# Patient Record
Sex: Female | Born: 1990 | Race: Black or African American | Hispanic: No | Marital: Single | Smoking: Current every day smoker
Health system: Southern US, Community
[De-identification: ages and names within clinical notes are randomized; demographics above are authoritative.]

## PROBLEM LIST (undated history)

## (undated) DIAGNOSIS — J40 Bronchitis, not specified as acute or chronic: Secondary | ICD-10-CM

## (undated) DIAGNOSIS — U071 COVID-19: Secondary | ICD-10-CM

## (undated) DIAGNOSIS — G43909 Migraine, unspecified, not intractable, without status migrainosus: Secondary | ICD-10-CM

## (undated) DIAGNOSIS — O139 Gestational [pregnancy-induced] hypertension without significant proteinuria, unspecified trimester: Secondary | ICD-10-CM

## (undated) DIAGNOSIS — E669 Obesity, unspecified: Secondary | ICD-10-CM

## (undated) DIAGNOSIS — A749 Chlamydial infection, unspecified: Secondary | ICD-10-CM

## (undated) HISTORY — PX: TONSILLECTOMY: SUR1361

## (undated) HISTORY — DX: Bronchitis, not specified as acute or chronic: J40

## (undated) HISTORY — DX: Obesity, unspecified: E66.9

## (undated) HISTORY — DX: Migraine, unspecified, not intractable, without status migrainosus: G43.909

## (undated) HISTORY — PX: LASIK: SHX215

## (undated) HISTORY — DX: Gestational (pregnancy-induced) hypertension without significant proteinuria, unspecified trimester: O13.9

## (undated) HISTORY — DX: COVID-19: U07.1

## (undated) HISTORY — PX: HX WISDOM TEETH EXTRACTION: SHX21

## (undated) HISTORY — DX: Chlamydial infection, unspecified: A74.9

---
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
## 2011-03-02 ENCOUNTER — Emergency Department (HOSPITAL_COMMUNITY)
Admission: EM | Admit: 2011-03-02 | Discharge: 2011-03-03 | Disposition: A | Discharge status: Home / Self Care | Payer: Medicaid Other | Attending: Emergency Medicine | Admitting: Emergency Medicine

## 2011-03-02 ENCOUNTER — Emergency Department (HOSPITAL_COMMUNITY): Payer: Medicaid Other

## 2011-03-02 DIAGNOSIS — N898 Other specified noninflammatory disorders of vagina: Secondary | ICD-10-CM | POA: Insufficient documentation

## 2011-03-02 DIAGNOSIS — R109 Unspecified abdominal pain: Secondary | ICD-10-CM | POA: Insufficient documentation

## 2011-03-02 DIAGNOSIS — N390 Urinary tract infection, site not specified: Secondary | ICD-10-CM | POA: Insufficient documentation

## 2011-03-02 LAB — CBC
Hemoglobin: 13.9 g/dL (ref 12.0–15.0)
MCH: 31.3 pg (ref 26.0–34.0)
MCHC: 34.6 g/dL (ref 30.0–36.0)
Platelets: 203 10*3/uL (ref 150–400)
RDW: 12.1 % (ref 11.5–15.5)

## 2011-03-02 LAB — DIFFERENTIAL
Basophils Absolute: 0 10*3/uL (ref 0.0–0.1)
Basophils Relative: 0 % (ref 0–1)
Eosinophils Absolute: 0.3 10*3/uL (ref 0.0–0.7)
Eosinophils Relative: 3 % (ref 0–5)
Monocytes Absolute: 0.9 10*3/uL (ref 0.1–1.0)
Monocytes Relative: 8 % (ref 3–12)
Neutro Abs: 6.2 10*3/uL (ref 1.7–7.7)

## 2011-03-02 LAB — WET PREP, GENITAL

## 2011-03-02 LAB — URINALYSIS, ROUTINE W REFLEX MICROSCOPIC
Nitrite: POSITIVE — AB
Protein, ur: 100 mg/dL — AB
Specific Gravity, Urine: 1.028 (ref 1.005–1.030)
Urobilinogen, UA: 1 mg/dL (ref 0.0–1.0)

## 2011-03-02 LAB — URINE MICROSCOPIC-ADD ON

## 2011-03-02 LAB — POCT PREGNANCY, URINE: Preg Test, Ur: NEGATIVE

## 2011-03-02 LAB — ABO/RH: ABO/RH(D): B POS

## 2011-03-02 LAB — HCG, QUANTITATIVE, PREGNANCY: hCG, Beta Chain, Quant, S: <1 m[IU]/mL (ref <5)

## 2011-03-05 LAB — GC/CHLAMYDIA PROBE AMP, GENITAL: Chlamydia, DNA Probe: NEGATIVE

## 2011-03-22 ENCOUNTER — Emergency Department (HOSPITAL_COMMUNITY): Payer: Medicaid Other

## 2011-03-22 ENCOUNTER — Encounter: Payer: Self-pay | Admitting: Cardiology

## 2011-03-22 ENCOUNTER — Emergency Department (HOSPITAL_COMMUNITY)
Admission: EM | Admit: 2011-03-22 | Discharge: 2011-03-22 | Disposition: A | Discharge status: Home / Self Care | Payer: Medicaid Other | Attending: Emergency Medicine | Admitting: Emergency Medicine

## 2011-03-22 DIAGNOSIS — M79609 Pain in unspecified limb: Secondary | ICD-10-CM | POA: Insufficient documentation

## 2011-03-22 DIAGNOSIS — F411 Generalized anxiety disorder: Secondary | ICD-10-CM | POA: Insufficient documentation

## 2011-03-22 DIAGNOSIS — M25561 Pain in right knee: Secondary | ICD-10-CM

## 2011-03-22 DIAGNOSIS — Y92009 Unspecified place in unspecified non-institutional (private) residence as the place of occurrence of the external cause: Secondary | ICD-10-CM | POA: Insufficient documentation

## 2011-03-22 DIAGNOSIS — M25569 Pain in unspecified knee: Secondary | ICD-10-CM | POA: Insufficient documentation

## 2011-03-22 DIAGNOSIS — M25539 Pain in unspecified wrist: Secondary | ICD-10-CM | POA: Insufficient documentation

## 2011-03-22 DIAGNOSIS — R51 Headache: Secondary | ICD-10-CM | POA: Insufficient documentation

## 2011-03-22 DIAGNOSIS — S63509A Unspecified sprain of unspecified wrist, initial encounter: Secondary | ICD-10-CM | POA: Insufficient documentation

## 2011-03-22 DIAGNOSIS — S63501A Unspecified sprain of right wrist, initial encounter: Secondary | ICD-10-CM

## 2011-03-22 MED ORDER — HYDROCODONE-ACETAMINOPHEN 5-500 MG PO TABS: 1.0000 | ORAL_TABLET | Freq: Four times a day (QID) | ORAL | Status: AC | PRN

## 2011-03-22 MED ORDER — OXYCODONE-ACETAMINOPHEN 5-325 MG PO TABS
1.0000 | ORAL_TABLET | Freq: Once | ORAL | Status: AC
  Administered 2011-03-22: 1 via ORAL
  Filled 2011-03-22: qty 1

## 2011-03-22 MED ORDER — IBUPROFEN 800 MG PO TABS: 800.0000 mg | ORAL_TABLET | Freq: Three times a day (TID) | ORAL | Status: AC

## 2011-03-22 MED ORDER — LORAZEPAM 1 MG PO TABS
1.0000 mg | ORAL_TABLET | Freq: Once | ORAL | Status: AC
  Administered 2011-03-22: 1 mg via ORAL
  Filled 2011-03-22: qty 1

## 2011-03-22 NOTE — Progress Notes (Signed)
Orthopedic Tech Progress Note Patient Details:  Carol Rich [birth year] [id number]  Other Ortho Devices Type of Ortho Device: Other (comment) (velcro wrist splint) Ortho Device Location: right wrist   Nikki Dom 03/22/2011, 3:26 PM

## 2011-03-22 NOTE — ED Provider Notes (Signed)
Medical screening examination/treatment/procedure(s) were performed by non-physician practitioner and as supervising physician I was immediately available for consultation/collaboration.  Doug Sou, MD 03/22/11 1544
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
## 2011-05-27 ENCOUNTER — Emergency Department (HOSPITAL_COMMUNITY)
Admission: EM | Admit: 2011-05-27 | Discharge: 2011-05-27 | Disposition: A | Discharge status: Home / Self Care | Payer: Self-pay | Attending: Emergency Medicine | Admitting: Emergency Medicine

## 2011-05-27 ENCOUNTER — Encounter (HOSPITAL_COMMUNITY): Payer: Self-pay | Admitting: Emergency Medicine

## 2011-05-27 DIAGNOSIS — S61209A Unspecified open wound of unspecified finger without damage to nail, initial encounter: Secondary | ICD-10-CM | POA: Insufficient documentation

## 2011-05-27 DIAGNOSIS — J45909 Unspecified asthma, uncomplicated: Secondary | ICD-10-CM | POA: Insufficient documentation

## 2011-05-27 DIAGNOSIS — S61411A Laceration without foreign body of right hand, initial encounter: Secondary | ICD-10-CM

## 2011-05-27 DIAGNOSIS — F411 Generalized anxiety disorder: Secondary | ICD-10-CM | POA: Insufficient documentation

## 2011-05-27 DIAGNOSIS — R064 Hyperventilation: Secondary | ICD-10-CM | POA: Insufficient documentation

## 2011-05-27 MED ORDER — ALPRAZOLAM 0.5 MG PO TABS
0.5000 mg | ORAL_TABLET | Freq: Once | ORAL | Status: AC
  Administered 2011-05-27: 0.5 mg via ORAL
  Filled 2011-05-27: qty 1

## 2011-05-27 MED ORDER — ACETAMINOPHEN 325 MG PO TABS
650.0000 mg | ORAL_TABLET | Freq: Once | ORAL | Status: AC
  Administered 2011-05-27: 650 mg via ORAL
  Filled 2011-05-27: qty 2
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
## 2011-05-27 NOTE — ED Notes (Signed)
Pt arrived at triage hyperventilating.  Pt has laceration to R hand at base of thumb.  Pt will not answer any questions.  Bandage placed at triage and pt encouraged to slow breathing.

## 2014-05-08 ENCOUNTER — Emergency Department (HOSPITAL_COMMUNITY): Payer: Self-pay | Admitting: EXTERNAL

## 2015-04-04 ENCOUNTER — Encounter (HOSPITAL_COMMUNITY): Payer: Self-pay | Admitting: *Deleted

## 2015-04-04 ENCOUNTER — Emergency Department (HOSPITAL_COMMUNITY): Payer: BLUE CROSS/BLUE SHIELD

## 2015-04-04 ENCOUNTER — Emergency Department (HOSPITAL_COMMUNITY)
Admission: EM | Admit: 2015-04-04 | Discharge: 2015-04-04 | Disposition: A | Discharge status: Home / Self Care | Payer: BLUE CROSS/BLUE SHIELD | Attending: Emergency Medicine | Admitting: Emergency Medicine

## 2015-04-04 DIAGNOSIS — J45909 Unspecified asthma, uncomplicated: Secondary | ICD-10-CM | POA: Diagnosis not present

## 2015-04-04 DIAGNOSIS — N939 Abnormal uterine and vaginal bleeding, unspecified: Secondary | ICD-10-CM | POA: Diagnosis present

## 2015-04-04 DIAGNOSIS — N946 Dysmenorrhea, unspecified: Secondary | ICD-10-CM | POA: Diagnosis not present

## 2015-04-04 DIAGNOSIS — Z3202 Encounter for pregnancy test, result negative: Secondary | ICD-10-CM | POA: Diagnosis not present

## 2015-04-04 DIAGNOSIS — F17210 Nicotine dependence, cigarettes, uncomplicated: Secondary | ICD-10-CM | POA: Insufficient documentation

## 2015-04-04 LAB — WET PREP, GENITAL
Sperm: NONE SEEN
TRICH WET PREP: NONE SEEN
Yeast Wet Prep HPF POC: NONE SEEN

## 2015-04-04 LAB — CBC
HEMATOCRIT: 40.3 % (ref 36.0–46.0)
Hemoglobin: 13.6 g/dL (ref 12.0–15.0)
MCH: 31.5 pg (ref 26.0–34.0)
MCHC: 33.7 g/dL (ref 30.0–36.0)
MCV: 93.3 fL (ref 78.0–100.0)
PLATELETS: 253 10*3/uL (ref 150–400)
RBC: 4.32 MIL/uL (ref 3.87–5.11)
RDW: 12.5 % (ref 11.5–15.5)
WBC: 9.7 10*3/uL (ref 4.0–10.5)

## 2015-04-04 LAB — I-STAT BETA HCG BLOOD, ED (MC, WL, AP ONLY): I-stat hCG, quantitative: <5 m[IU]/mL (ref <5)

## 2015-04-04 LAB — GC/CHLAMYDIA PROBE AMP (~~LOC~~) NOT AT ARMC
Chlamydia: NEGATIVE
Neisseria Gonorrhea: NEGATIVE

## 2015-04-04 LAB — ABO/RH: ABO/RH(D): B POS

## 2015-04-04 MED ORDER — NAPROXEN 500 MG PO TABS
500.0000 mg | ORAL_TABLET | Freq: Once | ORAL | Status: AC
  Administered 2015-04-04: 500 mg via ORAL
  Filled 2015-04-04: qty 1

## 2015-04-04 MED ORDER — NAPROXEN 500 MG PO TABS: 500.0000 mg | ORAL_TABLET | Freq: Two times a day (BID) | ORAL | Status: AC

## 2015-04-04 NOTE — Discharge Instructions (Signed)

## 2015-04-04 NOTE — ED Notes (Signed)
Pt reports lower abd pain and vaginal bleeding - LMP 02/06/15 pt admits to (+) home pregnancy test, has not been to seen by an OBGYN. Pt reports bleeding is moderate approx 4-5 pads during the day.

## 2015-04-04 NOTE — ED Provider Notes (Signed)
CSN: [id number]     Arrival date & time 04/04/15  0241 History   First MD Initiated Contact with Patient 04/04/15 0243     Chief Complaint  Patient presents with  . Vaginal Bleeding  . Abdominal Pain     (Consider location/radiation/quality/duration/timing/severity/associated sxs/prior Treatment) HPI Comments: Patient is a G67P0 female who presents to the emergency department for complaints of abdominal pain and vaginal bleeding. Patient states that her last menstrual period was on 02/06/2015. She reports taking a positive home pregnancy test. She has yet to follow up with an OB/GYN, but does have an appointment with [facility] in 2 days. She reports that her abdominal pain began yesterday. It was sharp and cramping in nature and located in her suprapubic region. No radiation of her pain. She also reports vaginal bleeding. She has required 4-5 pads since her bleeding began. No associated fever, nausea, vomiting or dysuria or hematuria, vaginal discharge, or bowel changes. No history of abdominal surgeries.  The history is provided by the patient. No language interpreter was used.    Past Medical History  Diagnosis Date  . Asthma    History reviewed. No pertinent past surgical history. History reviewed. No pertinent family history. Social History  Substance Use Topics  . Smoking status: Current Every Day Smoker -- 0.50 packs/day    Types: Cigarettes  . Smokeless tobacco: None  . Alcohol Use: Yes   OB History    Gravida Para Term Preterm AB TAB SAB Ectopic Multiple Living   1               Review of Systems  Constitutional: Negative for fever.  Gastrointestinal: Positive for abdominal pain. Negative for nausea and vomiting.  Genitourinary: Positive for vaginal bleeding and pelvic pain. Negative for dysuria.  All other systems reviewed and are negative.   Allergies  Review of patient's allergies indicates no known allergies.  Home Medications   Prior to Admission  medications   Medication Sig Start Date End Date Taking? Authorizing Provider  Vitamins A & D (VITAMIN A & D EX) Apply 1 application topically 3 (three) times daily as needed (for tattoos and piercings).   Yes Historical Provider, MD  naproxen (NAPROSYN) 500 MG tablet Take 1 tablet (500 mg total) by mouth 2 (two) times daily. 04/04/15   Antony Madura, PA-C   BP 108/74 mmHg  Pulse 80  Temp(Src) 98.1 F (36.7 C) (Oral)  Resp 16  SpO2 100%  LMP 02/06/2015 (Exact Date)   Physical Exam  Constitutional: She is oriented to person, place, and time. She appears well-developed and well-nourished. No distress.  HENT:  Head: Normocephalic and atraumatic.  Eyes: Conjunctivae and EOM are normal. No scleral icterus.  Neck: Normal range of motion.  Cardiovascular: Normal rate, regular rhythm and intact distal pulses.   Pulmonary/Chest: Effort normal. No respiratory distress. She has no wheezes.  Respirations even and unlabored  Abdominal: Soft. She exhibits no distension. There is tenderness. There is no rebound.  Abdomen soft. There is some mild focal tenderness in the suprapubic region. No masses or peritoneal signs noted. Bowel sounds normoactive.  Genitourinary: There is no rash, tenderness or lesion on the right labia. There is no rash, tenderness or lesion on the left labia. Uterus is tender (mild). Cervix exhibits no motion tenderness and no friability. Right adnexum displays no mass and no tenderness. Left adnexum displays tenderness (mild). Left adnexum displays no mass. There is bleeding (blood in vaginal vault; no clots noted) in the  vagina. No vaginal discharge found.  Musculoskeletal: Normal range of motion.  Neurological: She is alert and oriented to person, place, and time. She exhibits normal muscle tone. Coordination normal.  Skin: Skin is warm and dry. No rash noted. She is not diaphoretic. No erythema. No pallor.  Psychiatric: She has a normal mood and affect. Her behavior is normal.   Nursing note and vitals reviewed.   ED Course  Procedures (including critical care time) Labs Review Labs Reviewed  WET PREP, GENITAL - Abnormal; Notable for the following:    Clue Cells Wet Prep HPF POC PRESENT (*)    WBC, Wet Prep HPF POC RARE (*)    All other components within normal limits  CBC  I-STAT BETA HCG BLOOD, ED ([facility], [facility], [facility] ONLY)  ABO/RH  GC/CHLAMYDIA PROBE AMP ([facility]) NOT AT [facility]    Imaging Review US Transvaginal Non-ob  04/04/2015  CLINICAL DATA:  24 year old female with pelvic pain and bleeding x1 day. Negative home pregnancy test. EXAM: TRANSABDOMINAL AND TRANSVAGINAL ULTRASOUND OF PELVIS DOPPLER ULTRASOUND OF OVARIES TECHNIQUE: Both transabdominal and transvaginal ultrasound examinations of the pelvis were performed. Transabdominal technique was performed for global imaging of the pelvis including uterus, ovaries, adnexal regions, and pelvic cul-de-sac. It was necessary to proceed with endovaginal exam following the transabdominal exam to visualize the endometrium and the ovaries. Color and duplex Doppler ultrasound was utilized to evaluate blood flow to the ovaries. COMPARISON:  Ultrasound dated 03/02/2011 FINDINGS: Uterus Measurements: 8.0 x 4.2 x 5.4 cm. The uterus is anteverted. No fibroids or other mass visualized. The uterus is slightly heterogeneous with echogenic linear striation suggestive of adenomyosis. Clinical correlation is recommended. Endometrium Thickness: 2 mm. Minimal echogenic debris within the lower endometrial canal may represent blood products. No focal abnormality visualized. Right ovary Measurements: 3.5 x 3.8 x 3.4 cm. Normal appearance/no adnexal mass. Left ovary Measurements: 4.3 x 2.3 x 4.4 cm. Normal appearance/no adnexal mass. Pulsed Doppler evaluation of both ovaries demonstrates normal low-resistance arterial and venous waveforms. Other findings No free fluid. IMPRESSION: Slightly heterogeneous uterus with sonographic findings of possible  adenomyosis. Clinical correlation is recommended. Unremarkable ovaries.  Bilateral ovarian Doppler flow. Electronically Signed   By: Elgie Collard M.D.   On: 04/04/2015 04:46   US Pelvis Complete  04/04/2015  CLINICAL DATA:  24 year old female with pelvic pain and bleeding x1 day. Negative home pregnancy test. EXAM: TRANSABDOMINAL AND TRANSVAGINAL ULTRASOUND OF PELVIS DOPPLER ULTRASOUND OF OVARIES TECHNIQUE: Both transabdominal and transvaginal ultrasound examinations of the pelvis were performed. Transabdominal technique was performed for global imaging of the pelvis including uterus, ovaries, adnexal regions, and pelvic cul-de-sac. It was necessary to proceed with endovaginal exam following the transabdominal exam to visualize the endometrium and the ovaries. Color and duplex Doppler ultrasound was utilized to evaluate blood flow to the ovaries. COMPARISON:  Ultrasound dated 03/02/2011 FINDINGS: Uterus Measurements: 8.0 x 4.2 x 5.4 cm. The uterus is anteverted. No fibroids or other mass visualized. The uterus is slightly heterogeneous with echogenic linear striation suggestive of adenomyosis. Clinical correlation is recommended. Endometrium Thickness: 2 mm. Minimal echogenic debris within the lower endometrial canal may represent blood products. No focal abnormality visualized. Right ovary Measurements: 3.5 x 3.8 x 3.4 cm. Normal appearance/no adnexal mass. Left ovary Measurements: 4.3 x 2.3 x 4.4 cm. Normal appearance/no adnexal mass. Pulsed Doppler evaluation of both ovaries demonstrates normal low-resistance arterial and venous waveforms. Other findings No free fluid. IMPRESSION: Slightly heterogeneous uterus with sonographic findings of possible adenomyosis. Clinical correlation  is recommended. Unremarkable ovaries.  Bilateral ovarian Doppler flow. Electronically Signed   By: Elgie CollardArash  Radparvar M.D.   On: 04/04/2015 04:46   [place] Art/ven Flow Abd Pelv Doppler  04/04/2015  CLINICAL DATA:  24 year old female  with pelvic pain and bleeding x1 day. Negative home pregnancy test. EXAM: TRANSABDOMINAL AND TRANSVAGINAL ULTRASOUND OF PELVIS DOPPLER ULTRASOUND OF OVARIES TECHNIQUE: Both transabdominal and transvaginal ultrasound examinations of the pelvis were performed. Transabdominal technique was performed for global imaging of the pelvis including uterus, ovaries, adnexal regions, and pelvic cul-de-sac. It was necessary to proceed with endovaginal exam following the transabdominal exam to visualize the endometrium and the ovaries. Color and duplex Doppler ultrasound was utilized to evaluate blood flow to the ovaries. COMPARISON:  Ultrasound dated 03/02/2011 FINDINGS: Uterus Measurements: 8.0 x 4.2 x 5.4 cm. The uterus is anteverted. No fibroids or other mass visualized. The uterus is slightly heterogeneous with echogenic linear striation suggestive of adenomyosis. Clinical correlation is recommended. Endometrium Thickness: 2 mm. Minimal echogenic debris within the lower endometrial canal may represent blood products. No focal abnormality visualized. Right ovary Measurements: 3.5 x 3.8 x 3.4 cm. Normal appearance/no adnexal mass. Left ovary Measurements: 4.3 x 2.3 x 4.4 cm. Normal appearance/no adnexal mass. Pulsed Doppler evaluation of both ovaries demonstrates normal low-resistance arterial and venous waveforms. Other findings No free fluid. IMPRESSION: Slightly heterogeneous uterus with sonographic findings of possible adenomyosis. Clinical correlation is recommended. Unremarkable ovaries.  Bilateral ovarian Doppler flow. Electronically Signed   By: Elgie CollardArash  Radparvar M.D.   On: 04/04/2015 04:46     I have personally reviewed and evaluated these images and lab results as part of my medical decision-making.   EKG Interpretation None      MDM   Final diagnoses:  Dysmenorrhea    24 year old 442P0 female presents to the emergency department reporting suprapubic abdominal pain with vaginal bleeding, onset  yesterday. She reports a positive home pregnancy test with LMP 02/06/15. Pregnancy today is negative. Laboratory workup is noncontributory. No evidence of ovarian torsion, TOA, or ovarian cyst on ultrasound. No retained POC from potential miscarriage.   Patient is hemodynamically stable. She is resting comfortably, in no distress or discomfort, on reexamination. Symptoms consistent with dysmenorrhea. Doubt other emergent abdominopelvic process. Patient has app[facility]. Have advised her to keep this appointment. Will d/c with Naproxen. Return precautions given. Patient discharged in good condition with no unaddressed concerns.   Filed Vitals:   04/04/15 0251 04/04/15 0500  BP: 112/75 108/74  Pulse: 84 80  Temp: 98.1 F (36.7 C)   TempSrc: Oral   Resp: 20 16  SpO2: 100% 100%     Antony MaduraKelly Sabriah Hobbins, PA-C 04/04/15 [id number]  Azalia BilisKevin Campos, MD 04/04/15 [phone]

## 2015-07-18 ENCOUNTER — Encounter (HOSPITAL_COMMUNITY): Payer: Self-pay | Admitting: Emergency Medicine

## 2015-07-18 ENCOUNTER — Emergency Department (HOSPITAL_COMMUNITY): Payer: BLUE CROSS/BLUE SHIELD

## 2015-07-18 DIAGNOSIS — J45901 Unspecified asthma with (acute) exacerbation: Secondary | ICD-10-CM | POA: Diagnosis not present

## 2015-07-18 DIAGNOSIS — F17210 Nicotine dependence, cigarettes, uncomplicated: Secondary | ICD-10-CM | POA: Insufficient documentation

## 2015-07-18 DIAGNOSIS — R079 Chest pain, unspecified: Secondary | ICD-10-CM | POA: Diagnosis not present

## 2015-07-18 NOTE — ED Notes (Signed)
Pt. reports intermittent central chest pain with mild SOB , denies nausea or diaphoresis . Denies pain at triage , no cough or fever .

## 2015-07-19 ENCOUNTER — Emergency Department (HOSPITAL_COMMUNITY)
Admission: EM | Admit: 2015-07-19 | Discharge: 2015-07-19 | Disposition: A | Discharge status: Home / Self Care | Payer: BLUE CROSS/BLUE SHIELD | Attending: Emergency Medicine | Admitting: Emergency Medicine

## 2015-07-19 LAB — BASIC METABOLIC PANEL
Anion gap: 9 (ref 5–15)
BUN: 14 mg/dL (ref 6–20)
CHLORIDE: 108 mmol/L (ref 101–111)
CO2: 23 mmol/L (ref 22–32)
Calcium: 9.4 mg/dL (ref 8.9–10.3)
Creatinine, Ser: 0.95 mg/dL (ref 0.44–1.00)
GFR calc Af Amer: >60 mL/min (ref >60)
GFR calc non Af Amer: >60 mL/min (ref >60)
GLUCOSE: 87 mg/dL (ref 65–99)
POTASSIUM: 3.8 mmol/L (ref 3.5–5.1)
Sodium: 140 mmol/L (ref 135–145)

## 2015-07-19 LAB — CBC
HEMATOCRIT: 40.7 % (ref 36.0–46.0)
Hemoglobin: 13.6 g/dL (ref 12.0–15.0)
MCH: 31 pg (ref 26.0–34.0)
MCHC: 33.4 g/dL (ref 30.0–36.0)
MCV: 92.7 fL (ref 78.0–100.0)
Platelets: 241 10*3/uL (ref 150–400)
RBC: 4.39 MIL/uL (ref 3.87–5.11)
RDW: 12.2 % (ref 11.5–15.5)
WBC: 10.2 10*3/uL (ref 4.0–10.5)

## 2015-07-19 LAB — I-STAT TROPONIN, ED: Troponin i, poc: 0 ng/mL (ref 0.00–0.08)

## 2015-08-08 ENCOUNTER — Encounter (HOSPITAL_COMMUNITY): Payer: Self-pay | Admitting: *Deleted

## 2015-08-08 ENCOUNTER — Inpatient Hospital Stay (HOSPITAL_COMMUNITY)
Admission: AD | Admit: 2015-08-08 | Discharge: 2015-08-08 | Disposition: A | Discharge status: Home / Self Care | Payer: BLUE CROSS/BLUE SHIELD | Source: Ambulatory Visit | Attending: Obstetrics & Gynecology | Admitting: Obstetrics & Gynecology

## 2015-08-08 DIAGNOSIS — Z113 Encounter for screening for infections with a predominantly sexual mode of transmission: Secondary | ICD-10-CM | POA: Insufficient documentation

## 2015-08-08 DIAGNOSIS — A499 Bacterial infection, unspecified: Secondary | ICD-10-CM

## 2015-08-08 DIAGNOSIS — Z79899 Other long term (current) drug therapy: Secondary | ICD-10-CM | POA: Insufficient documentation

## 2015-08-08 DIAGNOSIS — N760 Acute vaginitis: Secondary | ICD-10-CM | POA: Insufficient documentation

## 2015-08-08 DIAGNOSIS — F17210 Nicotine dependence, cigarettes, uncomplicated: Secondary | ICD-10-CM | POA: Diagnosis not present

## 2015-08-08 DIAGNOSIS — Z202 Contact with and (suspected) exposure to infections with a predominantly sexual mode of transmission: Secondary | ICD-10-CM | POA: Diagnosis present

## 2015-08-08 DIAGNOSIS — B9689 Other specified bacterial agents as the cause of diseases classified elsewhere: Secondary | ICD-10-CM

## 2015-08-08 DIAGNOSIS — A549 Gonococcal infection, unspecified: Secondary | ICD-10-CM

## 2015-08-08 LAB — URINALYSIS, ROUTINE W REFLEX MICROSCOPIC
BILIRUBIN URINE: NEGATIVE
GLUCOSE, UA: NEGATIVE mg/dL
HGB URINE DIPSTICK: NEGATIVE
KETONES UR: NEGATIVE mg/dL
Leukocytes, UA: NEGATIVE
Nitrite: NEGATIVE
PROTEIN: NEGATIVE mg/dL
Specific Gravity, Urine: 1.02 (ref 1.005–1.030)
pH: 6 (ref 5.0–8.0)

## 2015-08-08 LAB — WET PREP, GENITAL
Sperm: NONE SEEN
Trich, Wet Prep: NONE SEEN
Yeast Wet Prep HPF POC: NONE SEEN

## 2015-08-08 LAB — GC/CHLAMYDIA PROBE AMP (~~LOC~~) NOT AT ARMC
CHLAMYDIA, DNA PROBE: NEGATIVE
Neisseria Gonorrhea: POSITIVE — AB

## 2015-08-08 LAB — POCT PREGNANCY, URINE: PREG TEST UR: NEGATIVE

## 2015-08-08 LAB — RPR: RPR: NONREACTIVE

## 2015-08-08 LAB — HIV ANTIBODY (ROUTINE TESTING W REFLEX): HIV SCREEN 4TH GENERATION: NONREACTIVE

## 2015-08-08 MED ORDER — METRONIDAZOLE 500 MG PO TABS: 500.0000 mg | ORAL_TABLET | Freq: Two times a day (BID) | ORAL | Status: AC

## 2015-08-08 MED ORDER — KETOROLAC TROMETHAMINE 60 MG/2ML IM SOLN: 60.0000 mg | Freq: Once | INTRAMUSCULAR | Status: DC

## 2015-08-08 NOTE — MAU Provider Note (Signed)
History   [id number]   Chief Complaint  Patient presents with  . Exposure to STD    HPI Carol Rich is a 25 y.o. female  G1P0 here desiring to be checked for sexually transmitted infections.  Informed by boyfriend that he has had sexual encounters with someone else and may have an infections.  Pt reports a vaginal discharge that she began using monistat for yesterday.  +UTI two weeks ago and recently completed antibiotic.  No current birth control use.  Last intercourse yesterday.      Patient's last menstrual period was 07/19/2015.  OB History  Gravida Para Term Preterm AB SAB TAB Ectopic Multiple Living  1             # Outcome Date GA Lbr Len/2nd Weight Sex Delivery Anes PTL Lv  1 Gravida      Vag-Spont   FD      Past Medical History  Diagnosis Date  . Asthma     History reviewed. No pertinent family history.  Social History   Social History  . Marital Status: Single    Spouse Name: N/A  . Number of Children: N/A  . Years of Education: N/A   Social History Main Topics  . Smoking status: Current Every Day Smoker -- 0.00 packs/day    Types: Cigarettes  . Smokeless tobacco: None  . Alcohol Use: Yes     Comment: LAST   TIME-   MARCH  . Drug Use: No  . Sexual Activity: Yes    Birth Control/ Protection: None   Other Topics Concern  . None   Social History Narrative    Allergies  Allergen Reactions  . Latex Itching    No current facility-administered medications on file prior to encounter.   Current Outpatient Prescriptions on File Prior to Encounter  Medication Sig Dispense Refill  . naproxen (NAPROSYN) 500 MG tablet Take 1 tablet (500 mg total) by mouth 2 (two) times daily. 30 tablet 0  . Vitamins A & D (VITAMIN A & D EX) Apply 1 application topically 3 (three) times daily as needed (for tattoos and piercings).       Review of Systems  Genitourinary: Positive for vaginal discharge (slight itching). Negative for pelvic pain.  All other systems  reviewed and are negative.    Physical Exam   Filed Vitals:   08/08/15 0045  BP: 128/82  Pulse: 71  Temp: 98.3 F (36.8 C)  TempSrc: Oral  Resp: 20  Height: 5\' 4"  (1.626 m)  Weight: 83.972 kg (185 lb 2 oz)    Physical Exam  Constitutional: She is oriented to person, place, and time. She appears well-developed and well-nourished. No distress.  HENT:  Head: Normocephalic.  Neck: Normal range of motion. Neck supple.  Cardiovascular: Normal rate, regular rhythm and normal heart sounds.   Respiratory: Effort normal and breath sounds normal. No respiratory distress.  GI: Soft. She exhibits no mass. There is no tenderness. There is no rebound, no guarding and no CVA tenderness.  Genitourinary: Cervix exhibits no motion tenderness and no discharge. Vaginal discharge (white, creamy) found.  Musculoskeletal: Normal range of motion. She exhibits no edema.  Neurological: She is alert and oriented to person, place, and time.  Skin: Skin is warm and dry.  Psychiatric: She has a normal mood and affect.    MAU Course  Procedures Results for orders placed or performed during the hospital encounter of 08/08/15 (from the past 24 hour(s))  Urinalysis, Routine w reflex  microscopic (not at [facility])     Status: None   Collection Time: 08/08/15 12:56 AM  Result Value Ref Range   Color, Urine YELLOW YELLOW   APPearance CLEAR CLEAR   Specific Gravity, Urine 1.020 1.005 - 1.030   pH 6.0 5.0 - 8.0   Glucose, UA NEGATIVE NEGATIVE mg/dL   Hgb urine dipstick NEGATIVE NEGATIVE   Bilirubin Urine NEGATIVE NEGATIVE   Ketones, ur NEGATIVE NEGATIVE mg/dL   Protein, ur NEGATIVE NEGATIVE mg/dL   Nitrite NEGATIVE NEGATIVE   Leukocytes, UA NEGATIVE NEGATIVE  Pregnancy, urine POC     Status: None   Collection Time: 08/08/15  1:00 AM  Result Value Ref Range   Preg Test, Ur NEGATIVE NEGATIVE  Wet prep, genital     Status: Abnormal   Collection Time: 08/08/15  1:20 AM  Result Value Ref Range   Yeast Wet  Prep HPF POC NONE SEEN NONE SEEN   Trich, Wet Prep NONE SEEN NONE SEEN   Clue Cells Wet Prep HPF POC PRESENT (A) NONE SEEN   WBC, Wet Prep HPF POC MANY (A) NONE SEEN   Sperm NONE SEEN      Assessment and Plan  Bacterial Vaginosis STD Screen  Plan: Discharge home RX Flagyl 500 mg BID x 7 days RPR/HIV/GC/CT pending   Marlis Edelson, CNM 08/08/2015 2:01 AM

## 2015-08-08 NOTE — MAU Note (Signed)
PT SAYS TONIGHT  AT WORK-  THAT HER BOYFRIEND  TEXTED HER  AND TOLD  WAS CHEATING   AND THINKS  HE HAS  STD- .     PT SAYS  SHE HAS VAG D/C  AND  STARTED  MONISTAT YESTERDAY.     HAD UTI   2 WEEKS  AGO- TOOK ALL ANTX.            NO BIRTH  CONTROL.   LAST SEX-  YESTERDAY.

## 2015-08-08 NOTE — Discharge Instructions (Signed)

## 2015-08-09 ENCOUNTER — Ambulatory Visit: Payer: BLUE CROSS/BLUE SHIELD

## 2015-08-09 DIAGNOSIS — A549 Gonococcal infection, unspecified: Secondary | ICD-10-CM

## 2015-08-10 ENCOUNTER — Inpatient Hospital Stay (HOSPITAL_COMMUNITY)
Admission: AD | Admit: 2015-08-10 | Discharge: 2015-08-10 | Disposition: A | Discharge status: Home / Self Care | Payer: BLUE CROSS/BLUE SHIELD | Source: Ambulatory Visit | Attending: Family Medicine | Admitting: Family Medicine

## 2015-08-10 ENCOUNTER — Telehealth: Payer: Self-pay | Admitting: Certified Nurse Midwife

## 2015-08-10 DIAGNOSIS — Z202 Contact with and (suspected) exposure to infections with a predominantly sexual mode of transmission: Secondary | ICD-10-CM | POA: Diagnosis not present

## 2015-08-10 MED ORDER — AZITHROMYCIN 250 MG PO TABS
1000.0000 mg | ORAL_TABLET | Freq: Once | ORAL | Status: AC
  Administered 2015-08-10: 1000 mg via ORAL
  Filled 2015-08-10: qty 4

## 2015-08-10 MED ORDER — CEFTRIAXONE SODIUM 250 MG IJ SOLR
250.0000 mg | Freq: Once | INTRAMUSCULAR | Status: AC
  Administered 2015-08-10: 250 mg via INTRAMUSCULAR
  Filled 2015-08-10: qty 250

## 2015-08-10 NOTE — Telephone Encounter (Signed)
Pt presents to MA[facility] stating [facility] and get a shot to be treated for Gonorhhea. Partner treatment RX was given. Orders placed in Epic.

## 2015-11-14 ENCOUNTER — Ambulatory Visit (INDEPENDENT_AMBULATORY_CARE_PROVIDER_SITE_OTHER): Payer: BLUE CROSS/BLUE SHIELD

## 2015-11-14 DIAGNOSIS — Z3202 Encounter for pregnancy test, result negative: Secondary | ICD-10-CM | POA: Diagnosis not present

## 2015-11-14 DIAGNOSIS — Z3200 Encounter for pregnancy test, result unknown: Secondary | ICD-10-CM

## 2015-11-14 LAB — POCT PREGNANCY, URINE: PREG TEST UR: NEGATIVE

## 2015-11-14 NOTE — Progress Notes (Signed)
Patient presented today for a pregnancy test. Test confirms she is not pregnant at this time. Patient verbalizes understanding.

## 2016-08-25 IMAGING — US US PELVIS COMPLETE
1 series · 13 of 25 positions shown · non-contrast
Comparison: Ultrasound dated 03/02/2011

CLINICAL DATA: 24-year-old female with pelvic pain and bleeding x1
day. Negative home pregnancy test.

EXAM:
TRANSABDOMINAL AND TRANSVAGINAL ULTRASOUND OF PELVIS
DOPPLER ULTRASOUND OF OVARIES
TECHNIQUE: Both transabdominal and transvaginal ultrasound examinations of the
pelvis were performed. Transabdominal technique was performed for
global imaging of the pelvis including uterus, ovaries, adnexal
regions, and pelvic cul-de-sac.
It was necessary to proceed with endovaginal exam following the
transabdominal exam to visualize the endometrium and the ovaries.
Color and duplex Doppler ultrasound was utilized to evaluate blood
flow to the ovaries.

[Series 1: us pelvis complete · 0.20mm/px · 13 of 49 slices shown]
[im 1/49]
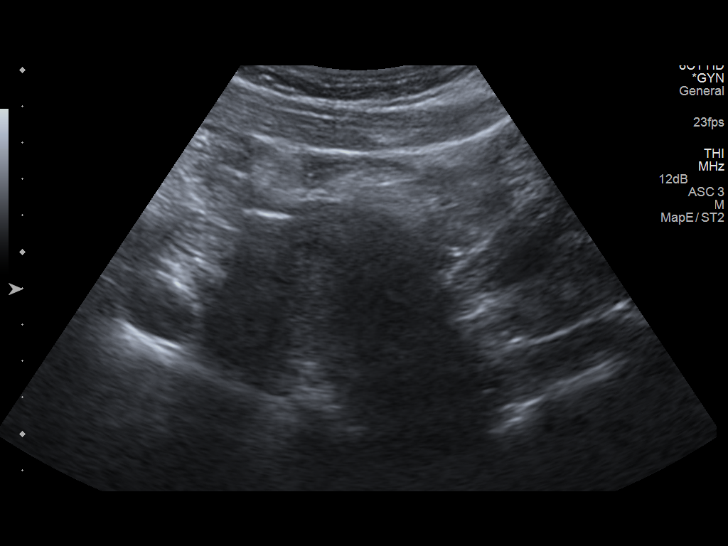
[im 5/49]
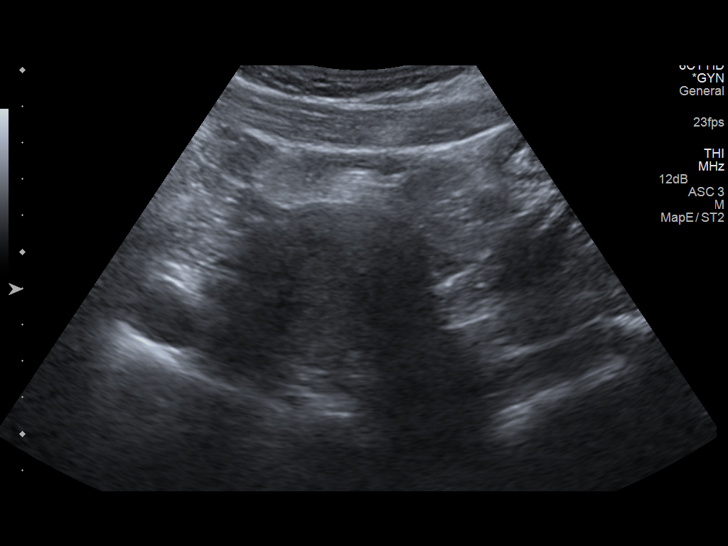
[im 9/49]
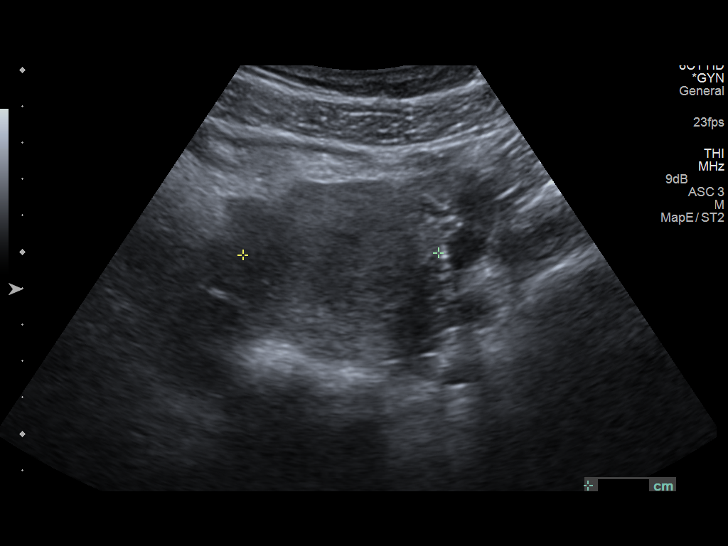
[im 13/49]
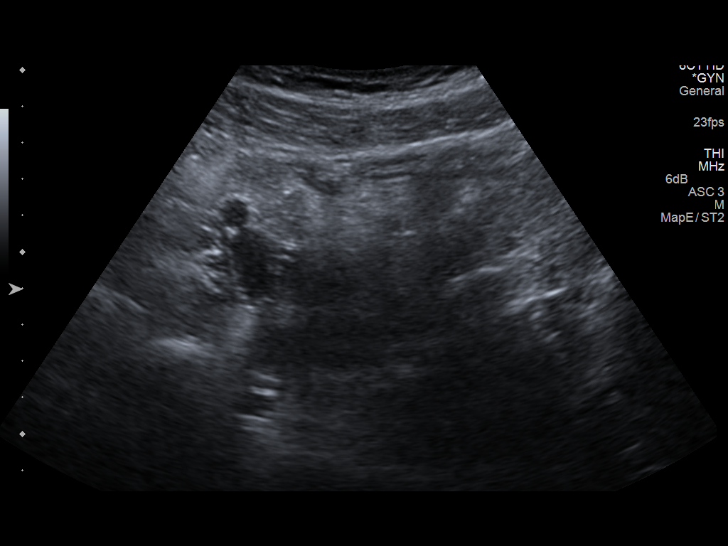
[im 17/49]
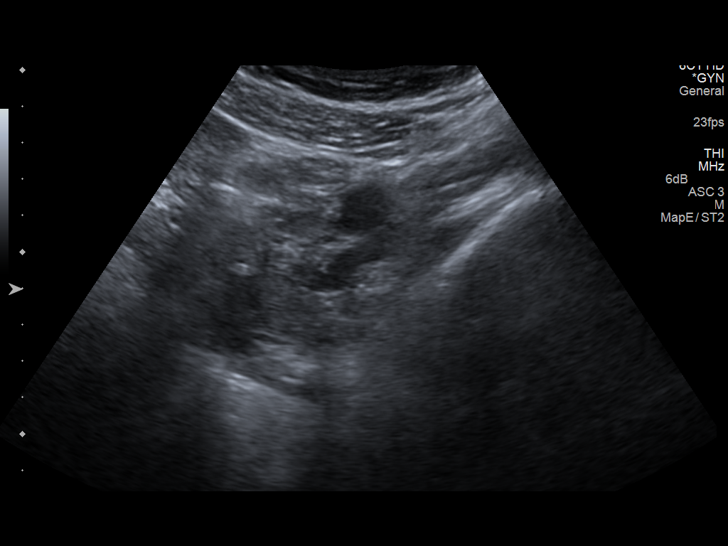
[im 21/49]
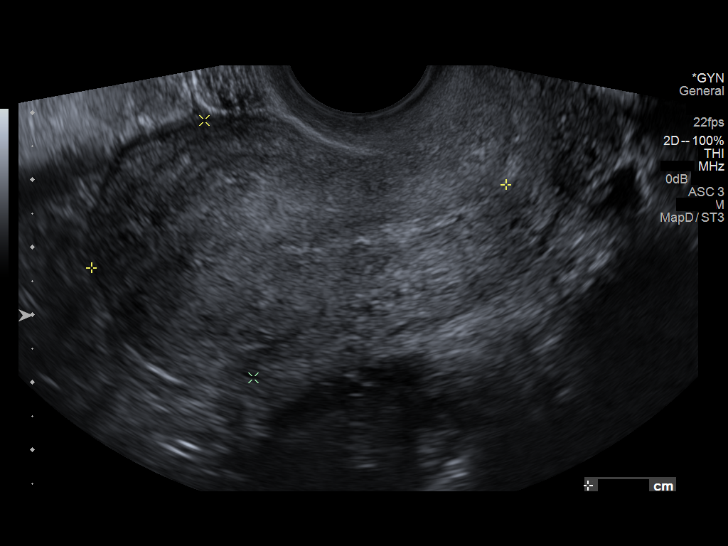
[im 25/49]
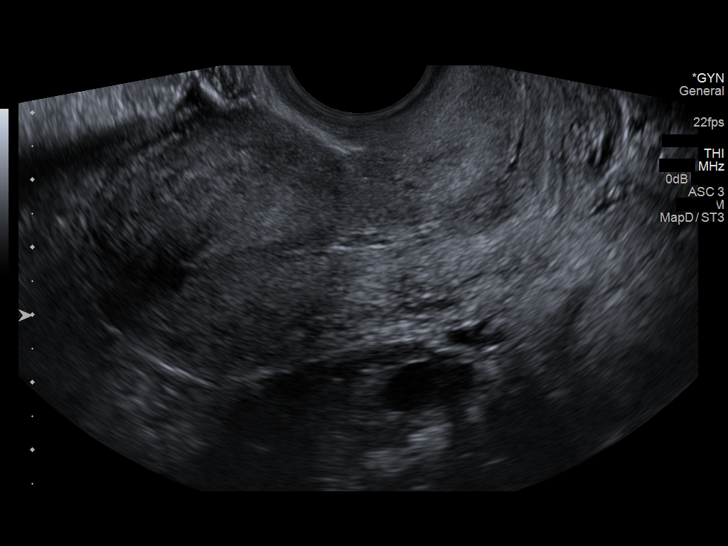
[im 29/49]
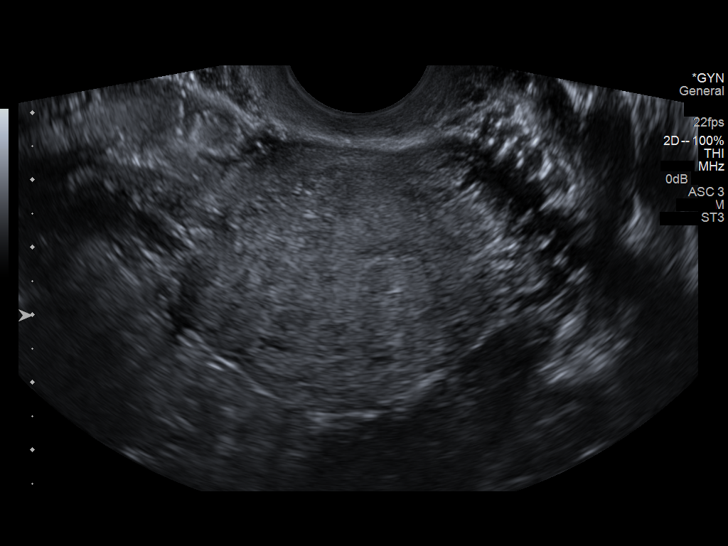
[im 33/49]
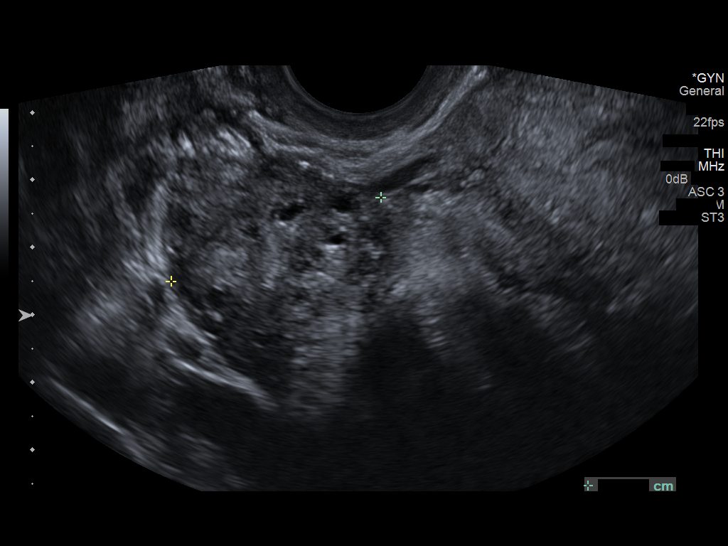
[im 37/49]
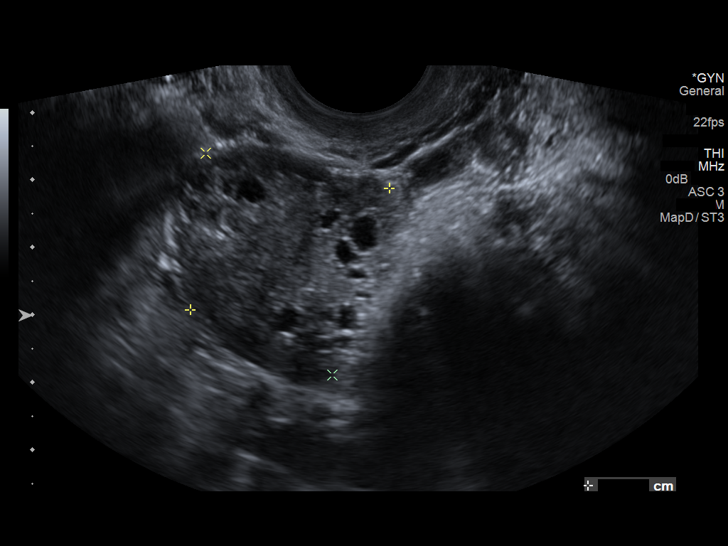
[im 41/49]
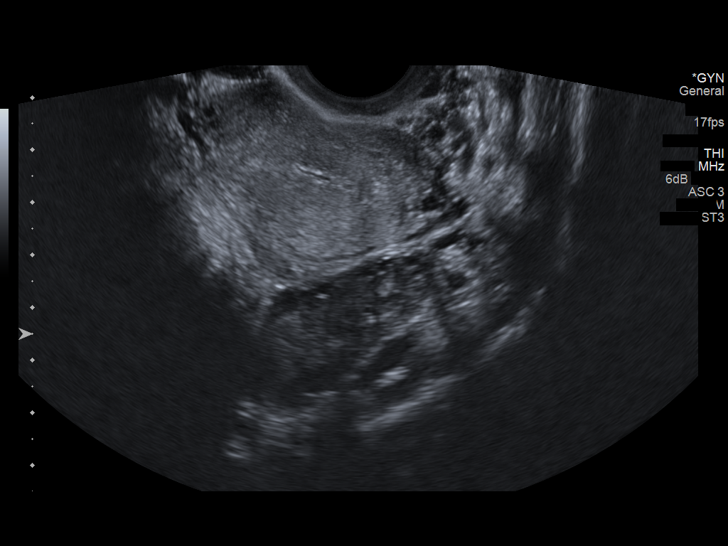
[im 45/49]
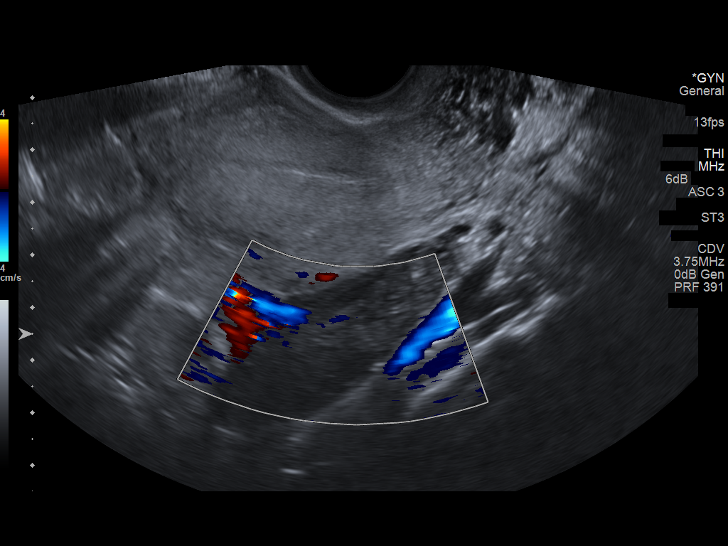
[im 49/49]
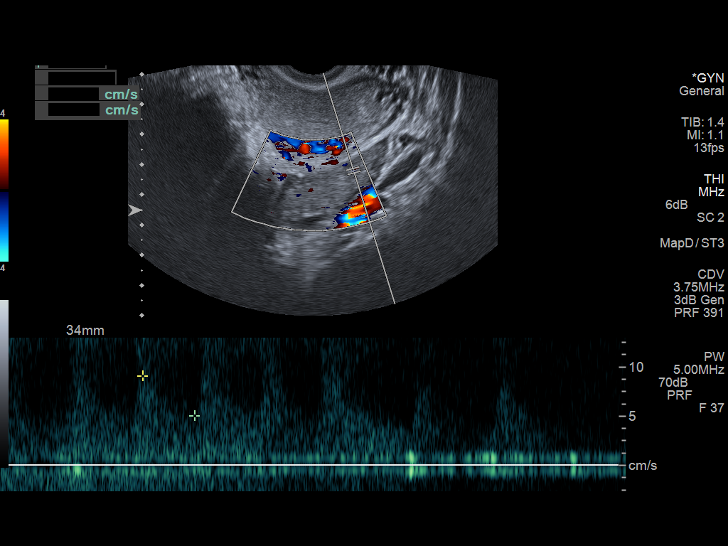

[13 of 25 positions shown; findings below may reference images not displayed]

FINDINGS: Uterus

Measurements: 8.0 x 4.2 x 5.4 cm. The uterus is anteverted. No
fibroids or other mass visualized. The uterus is slightly
heterogeneous with echogenic linear striation suggestive of
adenomyosis. Clinical correlation is recommended.

Endometrium

Thickness: 2 mm. Minimal echogenic debris within the lower
endometrial canal may represent blood products.. No focal
abnormality visualized.

Right ovary

Measurements: 3.5 x 3.8 x 3.4 cm.. Normal appearance/no adnexal
mass.

Left ovary

Measurements: 4.3 x 2.3 x 4.4 cm.. Normal appearance/no adnexal
mass.

Pulsed Doppler evaluation of both ovaries demonstrates normal
low-resistance arterial and venous waveforms.

Other findings

No free fluid.
IMPRESSION: Slightly heterogeneous uterus with sonographic findings of possible
adenomyosis. Clinical correlation is recommended.

Unremarkable ovaries.  Bilateral ovarian Doppler flow.

## 2019-06-12 ENCOUNTER — Encounter (INDEPENDENT_AMBULATORY_CARE_PROVIDER_SITE_OTHER): Payer: Self-pay

## 2019-06-12 DIAGNOSIS — O43109 Malformation of placenta, unspecified, unspecified trimester: Secondary | ICD-10-CM

## 2019-06-12 NOTE — Nursing Note (Signed)
Request for High Risk OB Consult from: Gwynneth Aliment, MD    Reason for request: Placental changes     Age: 29    Gravida/Para: G1P0000    Best EDD: 07/31/2019 (dated by LMP, 4 day difference from [redacted]w[redacted]d fetal ultrasound)    Current Gestational age on:  06/12/2019 = [redacted]w[redacted]d         Chart information:      Follow up fetal ultrasound 06/03/2019 ([redacted]w[redacted]d)   BPD [redacted]w[redacted]d   HC [redacted]w[redacted]d   AC 100w4d   FL [redacted]w[redacted]d   EFW 1888 grams (52.2%)   AFI subjectively normal   There is a 4.4 cm ovoid lesion projecting over the periphery of the placenta which may have some rim calcification. This does not have the typical hypoechoic appearance of a chorioangioma    Obesity   BMI 37   History abnormal pap smear   Most recent pap smear 01/06/2019 - normal        Genetic testing   Date Result   1st Trimester Screen 01/20/2019 negative   Nuchal Translucency 01/20/2019 0.97 mm   MSAFP 03/02/2019 Negative    Cell Free DNA     Fetal Karyotype     Maternal Karyotype     CF, SMA, FXS carrier screen  03/02/2019 negative         Date  Result   Date  Result   ABO/RH  01/06/2019  A positive  Gonorrhea & Chlamydia  01/06/2019  negative    Antibody Screen  01/06/2019  negative  UDS  01/06/2019  negative    H/H  05/04/2019  11.5/34.0 Pap Smear  01/06/2019  negative    PLT  05/04/2019  405 Hemoglobin A1C     HBsAg  01/06/2019  negative  1 hr GTT  05/04/2019  121   Rubella  01/06/2019  non-immune 2 hr GTT     RPR  01/06/2019  negative 3 hr GTT     HIV  01/06/2019  negative TSH     HCV  01/06/2019  negative  Free T4     GBS   Free T3         Schedule   O MFM consult  O Genetic consult  O Fetal ultrasound      O   singleton    O  Twins   O Triplets   O  _______  O < 14 week   O Nuchal translucency  O Limited  O Anatomy   O Growth (follow up)  O UA Doppler  O MCA Doppler  O Fetal echocardiogram  O Maternal echocardiogram  O Diabetes Education  O Other _______________________________

## 2019-07-01 ENCOUNTER — Encounter (INDEPENDENT_AMBULATORY_CARE_PROVIDER_SITE_OTHER): Payer: Self-pay

## 2019-07-01 DIAGNOSIS — O43109 Malformation of placenta, unspecified, unspecified trimester: Secondary | ICD-10-CM

## 2019-08-17 ENCOUNTER — Other Ambulatory Visit: Payer: Self-pay

## 2019-08-17 ENCOUNTER — Ambulatory Visit: Payer: BC Managed Care – PPO | Attending: Podiatrist

## 2019-08-17 DIAGNOSIS — Z0000 Encounter for general adult medical examination without abnormal findings: Secondary | ICD-10-CM | POA: Insufficient documentation

## 2019-08-17 LAB — HEPATIC FUNCTION PANEL
ALBUMIN: 4 g/dL (ref 3.5–5.0)
ALKALINE PHOSPHATASE: 222 U/L — ABNORMAL HIGH (ref 40–110)
ALT (SGPT): 61 U/L — ABNORMAL HIGH (ref 8–22)
AST (SGOT): 50 U/L — ABNORMAL HIGH (ref 8–45)
BILIRUBIN DIRECT: 0.2 mg/dL (ref 0.1–0.4)
BILIRUBIN TOTAL: 0.5 mg/dL (ref 0.3–1.3)
PROTEIN TOTAL: 7.5 g/dL (ref 6.4–8.3)

## 2019-08-18 LAB — KOH PREP

## 2019-10-23 ENCOUNTER — Encounter (HOSPITAL_COMMUNITY): Payer: Self-pay

## 2020-05-11 ENCOUNTER — Other Ambulatory Visit: Payer: Self-pay

## 2020-05-11 ENCOUNTER — Ambulatory Visit: Payer: BC Managed Care – PPO | Attending: Family | Admitting: Family

## 2020-05-11 ENCOUNTER — Ambulatory Visit (INDEPENDENT_AMBULATORY_CARE_PROVIDER_SITE_OTHER): Payer: BC Managed Care – PPO

## 2020-05-11 DIAGNOSIS — J029 Acute pharyngitis, unspecified: Secondary | ICD-10-CM | POA: Insufficient documentation

## 2020-05-11 DIAGNOSIS — U071 COVID-19: Secondary | ICD-10-CM | POA: Insufficient documentation

## 2020-05-13 ENCOUNTER — Telehealth (INDEPENDENT_AMBULATORY_CARE_PROVIDER_SITE_OTHER): Payer: Self-pay | Admitting: NURSE PRACTITIONER

## 2020-05-13 LAB — COVID-19 ~~LOC~~ MOLECULAR LAB TESTING: SARS-CoV-2: DETECTED — AB

## 2020-08-29 NOTE — Progress Notes (Unsigned)
OB/GYN, [address]  [place] [address]  Operated by [facility]  Progress Note    Name: Crystal Sutton MRN:  [id number]   Date: 08/30/2020 Age: 30 y.o.        ANNUAL VISIT    PATIENT: Crystal Sutton  CHART NUMBER: [id number]  DATE OF SERVICE: 08/30/2020    CC: "Annual exam"    HPI: Crystal Sutton is a 30 y.o. year old G1P0.  She presents to clinic for her annual exam. She is doing well. She has complaints of chunky discharge for a few days.  Often, but not always related to intercourse.  Denies itching, burning, or odor.      REVIEW OF SYSTEM  Pertinent review of system as per HPI.    OB History:  OB History   Gravida Para Term Preterm AB Living   _0 0 0 1   SAB IAB Ectopic Multiple Live Births   0 0   0 1      # Outcome Date GA Lbr Len/2nd Weight Sex Delivery Anes PTL Lv   1 Term                PAST MEDICAL HISTORY:  Past Medical History:   Diagnosis Date   . Bronchitis    . Chlamydia    . Gestational hypertension    . Migraines    . Obesity          Patient is 30 y.o. and reports N/A, due to age, fractures in the last six months.      PAST SURGICAL HISTORY:          FAMILY HISTORY:  Family Medical History:     Problem Relation (Age of Onset)    Alcohol abuse Father    Coronary Artery Disease Paternal Grandmother    Diabetes Maternal Grandfather    Obesity Mother, Maternal Grandmother, Maternal Grandfather            SOCIAL HISTORY:  Social History     Socioeconomic History   . Marital status: Married   Occupational History   . Occupation: [employer]   Tobacco Use   . Smoking status: Never Smoker   . Smokeless tobacco: Never Used   Vaping Use   . Vaping Use: Never used   Substance and Sexual Activity   . Alcohol use: Yes     Comment: occasional   . Drug use: Never   . Sexual activity: Yes     Partners: Male     Birth control/protection: Pill        CURRENT MEDICATIONS:   Current Outpatient Medications   Medication Sig   . cyclobenzaprine (FLEXERIL) 10 mg Oral  Tablet cyclobenzaprine Take 1 tablet (Oral) 3 times per day PRN [id number] tablet 3 times per day Oral No set duration recorded No set duration amount recorded suspended 10 mg   . KURVELO, 28, 0.15-0.03 mg Oral Tablet Take 1 Tablet by mouth Once a day       ALLERGIES:  Patient has no known allergies.     PHYSICAL EXAMINATION:   Vitals:    08/30/20 0923   BP: 100/84   Weight: 88 kg (194 lb)   Height: 1.626 m (_1 )   BMI: 33.37         Body mass index is 33.3 kg/m.   Physical Exam  Constitutional:       General: She is awake.  Appearance: Normal appearance.   Genitourinary:      No lesions in the vagina.      Right Labia: No rash, tenderness, lesions, skin changes or Bartholin's cyst.     Left Labia: No tenderness, lesions, skin changes, Bartholin's cyst or rash.     No vaginal discharge, erythema, tenderness, bleeding, ulceration or granulation tissue.      No vaginal prolapse present.     No vaginal atrophy present.       Right Adnexa: not tender, not full and no mass present.     Left Adnexa: not tender, not full and no mass present.     No cervical discharge, friability, lesion or polyp.      Uterus is not enlarged or tender.   Breasts: Breasts are symmetrical.      Right: No swelling, mass, nipple discharge, skin change or tenderness.      Left: No swelling, mass, nipple discharge, skin change or tenderness.       Neck:      Thyroid: No thyroid mass, thyromegaly or thyroid tenderness.   Cardiovascular:      Rate and Rhythm: Normal rate and regular rhythm.      Heart sounds: Normal heart sounds.   Pulmonary:      Effort: No tachypnea, bradypnea, accessory muscle usage or prolonged expiration.      Breath sounds: Normal breath sounds.   Abdominal:      General: There is no distension.      Palpations: Abdomen is soft. There is no hepatomegaly or mass.   Musculoskeletal:      Cervical back: Normal range of motion.   Lymphadenopathy:      Cervical:      Right cervical: No superficial, deep or posterior cervical  adenopathy.     Left cervical: No superficial, deep or posterior cervical adenopathy.   Neurological:      Mental Status: She is alert.          ASSESSMENT:  (Z01.419) Encounter for well woman exam with routine gynecological exam  (primary encounter diagnosis)    (N89.8) Vaginal discharge  Plan: CHLAMYDIA / NEISSERIA DNA BY PCR, VAGINITIS         PATHOGENS: BACTERIAL, CANDIDA, AND TV PANEL,         TMA       PLAN:  1. Full annual check up completed.    2. Pap 01/07/2019 WNL.  3. Happy on OCPs.  Having menstrual migraines.  Denies aura but has light sensitivity.  Happening with most period.  Discussed taking OCPs continuously.  She is otherwise happy on OCPs and would like to try this.  RF e scribed.    4. Vaginal discharge- had a small amount this am.  Exam normal.  Ex husband gave her chlamydia or gonorrhea.  Discussed wet prep vs Sure swab.  Patient prefers Sure swab with GC/C and thinks she has already met her deductible.  Will call with results.    5. Denies Fhx of breast cancer.    Orders Placed This Encounter   . VAGINITIS PATHOGENS: BACTERIAL, CANDIDA, AND TV PANEL, TMA   . CHLAMYDIA / NEISSERIA DNA BY PCR        Follow up appointment in 1 year for annual exam     Renea Ee, PA-C

## 2020-08-30 ENCOUNTER — Encounter (INDEPENDENT_AMBULATORY_CARE_PROVIDER_SITE_OTHER): Payer: Self-pay | Admitting: Medical

## 2020-08-30 ENCOUNTER — Ambulatory Visit: Payer: BC Managed Care – PPO | Attending: Medical | Admitting: Medical

## 2020-08-30 ENCOUNTER — Other Ambulatory Visit (INDEPENDENT_AMBULATORY_CARE_PROVIDER_SITE_OTHER): Payer: Self-pay

## 2020-08-30 ENCOUNTER — Ambulatory Visit (HOSPITAL_COMMUNITY): Payer: BC Managed Care – PPO

## 2020-08-30 ENCOUNTER — Other Ambulatory Visit: Payer: Self-pay

## 2020-08-30 VITALS — BP 100/84 | Ht 64.0 in | Wt 194.0 lb

## 2020-08-30 DIAGNOSIS — B351 Tinea unguium: Secondary | ICD-10-CM | POA: Insufficient documentation

## 2020-08-30 DIAGNOSIS — N898 Other specified noninflammatory disorders of vagina: Secondary | ICD-10-CM | POA: Insufficient documentation

## 2020-08-30 DIAGNOSIS — Z01419 Encounter for gynecological examination (general) (routine) without abnormal findings: Secondary | ICD-10-CM | POA: Insufficient documentation

## 2020-08-30 LAB — HEPATIC FUNCTION PANEL
ALBUMIN/GLOBULIN RATIO: 1.4 — ABNORMAL LOW (ref 1.5–2.5)
ALBUMIN: 4.5 g/dL (ref 3.5–5.0)
ALKALINE PHOSPHATASE: 143 U/L — ABNORMAL HIGH (ref 38–126)
ALT (SGPT): 33 U/L (ref <35)
AST (SGOT): 32 U/L (ref 14–36)
BILIRUBIN DIRECT: 0 mg/dL (ref 0.0–0.3)
BILIRUBIN TOTAL: 0.9 mg/dL (ref 0.2–1.3)
PROTEIN TOTAL: 7.7 g/dL (ref 6.3–8.2)

## 2020-08-30 LAB — CHLAMYDIA / NEISSERIA DNA BY PCR
CHLAMYDIA TRACHOMATIS PCR: NOT DETECTED
NEISSERIA GONORRHOEAE PCR: NOT DETECTED

## 2020-08-31 ENCOUNTER — Other Ambulatory Visit (INDEPENDENT_AMBULATORY_CARE_PROVIDER_SITE_OTHER): Payer: Self-pay | Admitting: Medical

## 2020-08-31 DIAGNOSIS — N760 Acute vaginitis: Secondary | ICD-10-CM

## 2020-08-31 LAB — VAGINITIS PATHOGENS: BACTERIAL, CANDIDA, AND TV PANEL, TMA
BACTERIAL VAGINOSIS: POSITIVE — AB
CANDIDA GLABRATA: NEGATIVE
CANDIDA SPECIES: NEGATIVE
TRICHOMONAS VAGINALIS: NEGATIVE

## 2020-08-31 MED ORDER — METRONIDAZOLE 500 MG TABLET
500.0000 mg | ORAL_TABLET | Freq: Two times a day (BID) | ORAL | 0 refills | Status: DC
Start: 2020-08-31 — End: 2021-08-18

## 2020-09-17 ENCOUNTER — Other Ambulatory Visit (INDEPENDENT_AMBULATORY_CARE_PROVIDER_SITE_OTHER): Payer: Self-pay | Admitting: Medical

## 2020-09-19 NOTE — Telephone Encounter (Signed)
Pt had yearly 08/30/20 and scheduled next year for yearly- can we approve? Crystal Blackshire, MA  09/19/2020, 08:02

## 2021-02-24 ENCOUNTER — Encounter (INDEPENDENT_AMBULATORY_CARE_PROVIDER_SITE_OTHER): Payer: Self-pay | Admitting: GENERAL

## 2021-07-12 ENCOUNTER — Other Ambulatory Visit (INDEPENDENT_AMBULATORY_CARE_PROVIDER_SITE_OTHER): Payer: Self-pay | Admitting: Medical

## 2021-07-12 NOTE — Telephone Encounter (Signed)
Yearly: 09/01/21  Okay to send? Cleophus Molt, [place]  07/12/2021, 07:32

## 2021-08-18 ENCOUNTER — Encounter (INDEPENDENT_AMBULATORY_CARE_PROVIDER_SITE_OTHER): Payer: Self-pay | Admitting: GENERAL

## 2021-08-18 ENCOUNTER — Other Ambulatory Visit: Payer: Self-pay

## 2021-08-18 ENCOUNTER — Ambulatory Visit: Payer: BC Managed Care – PPO | Attending: Internal Medicine | Admitting: GENERAL

## 2021-08-18 VITALS — BP 132/84 | HR 84 | Resp 16 | Ht 64.0 in | Wt 222.4 lb

## 2021-08-18 DIAGNOSIS — M2669 Other specified disorders of temporomandibular joint: Secondary | ICD-10-CM

## 2021-08-18 DIAGNOSIS — R0982 Postnasal drip: Secondary | ICD-10-CM

## 2021-08-18 DIAGNOSIS — R058 Other specified cough: Secondary | ICD-10-CM

## 2021-08-18 MED ORDER — LORATADINE 10 MG TABLET
10.0000 mg | ORAL_TABLET | Freq: Every day | ORAL | 0 refills | Status: AC
Start: 2021-08-18 — End: 2021-09-08

## 2021-08-18 MED ORDER — DICLOFENAC 1 % TOPICAL GEL
Freq: Four times a day (QID) | CUTANEOUS | 0 refills | Status: AC
Start: 2021-08-18 — End: ?

## 2021-08-18 NOTE — Progress Notes (Signed)
[facility]    Patient Follow-up    PATIENT NAME: Crystal Sutton  MRN: [id number]  DOB: 1991-02-19  Date of Service: 08/18/2021    SUBJECTIVE:      HPI:  Riahna Plass is a 31 y.o. female with PMH of  who presents for   Chief Complaint   Patient presents with   . Sore Throat     Started 3 weeks ago - Covid and strep tests were negative - went to [facility] and treated with inhaler, steroids and mucinex - remains short of breath and extremely tired   . 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 Two weeks ago she had rhinorrhea, and odynophagia. Patient went to [facility] with negative COVID and rapid strep throat. She endorses a lingering cough with some mucus production. She endorses breathlessness with coughing flares. Patient is SOB with routine activity. Denies history of smoking. Denies fevers/chills. CXR performed at [facility] was negative for opacity per patient. She has given a short course of steroids, Albuterol inhaler and steroids. 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 She states she has been clenching her jaw recently with recent URI and her symptoms have flared within the past 1 week. She has used custom mouth brace for 6 months which helped. Patient has been taking 500 mg Tylenol and 400mg  Ibuprofen BID which has improved her pain. She has been evaluated by specialists in the past and was told she required surgical management however states this not an option at the time financially. She is not interested in PT referral. 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      Review of Systems:   Review of Systems   Constitutional: Positive for fatigue and fever. Negative for activity change and chills.   HENT: Positive for congestion, dental problem and postnasal drip. Negative for  facial swelling and trouble swallowing.    Respiratory: Positive for cough and shortness of breath. Negative for chest tightness and wheezing.    Cardiovascular: Negative.    Gastrointestinal: Negative.    Genitourinary: Negative.    Musculoskeletal: Negative.    Skin: Negative.    Neurological: Negative.        MEDICAL HISTORY   Past Medical History:  Past Medical History:   Diagnosis Date   . COVID-19    . Gestational hypertension    . Migraines    . Obesity            Past Surgical History:  Past Surgical History:   Procedure Laterality Date   . HX WISDOM TEETH EXTRACTION     . LASIK         Medications:  Current Outpatient Medications   Medication Instructions   . cyclobenzaprine (FLEXERIL) 10 mg Oral Tablet cyclobenzaprine Take 1 tablet (Oral) 3 times per day PRN [id number] tablet 3 times per day Oral No set duration recorded No set duration amount recorded suspended 10 mg   . diclofenac sodium (VOLTAREN) 1 % Gel Apply Topically, 4 TIMES DAILY   . KURVELO, 28, 0.15-0.03 mg Oral Tablet TAKE 1 TABLET BY MOUTH EVERY DAY   . loratadine (CLARITIN) 10 mg, Oral, DAILY       Allergies:  No Known Allergies    Family  History:  Family Medical History:     Problem Relation (Age of Onset)    Alcohol abuse Father    Coronary Artery Disease Paternal Grandmother    Diabetes Maternal Grandfather    Obesity Mother, Maternal Grandmother, Maternal Grandfather            Social History:  Social History     Tobacco Use   . Smoking status: Never   . Smokeless tobacco: Never   Vaping Use   . Vaping Use: Never used   Substance Use Topics   . Alcohol use: Yes     Comment: occasional   . Drug use: Never       OBJECTIVE     BP 132/84 (Site: Right, Patient Position: Sitting, Cuff Size: Adult Large)   Pulse 84   Resp 16   Ht 1.626 m (5\' 4" )   Wt 101 kg (222 lb 6.4 oz)   BMI 38.17 kg/m      Physical Exam  Constitutional:       Appearance: She is obese.   HENT:      Mouth/Throat:      Mouth: Mucous membranes are moist.      Pharynx:  Posterior oropharyngeal erythema present. No oropharyngeal exudate.   Cardiovascular:      Rate and Rhythm: Normal rate and regular rhythm.      Pulses: Normal pulses.   Pulmonary:      Effort: Pulmonary effort is normal. No respiratory distress.      Breath sounds: Normal breath sounds. No wheezing or rales.   Abdominal:      General: Abdomen is flat.   Musculoskeletal:         General: Normal range of motion.      Cervical back: Normal range of motion.   Lymphadenopathy:      Cervical: No cervical adenopathy.   Skin:     General: Skin is warm.      Capillary Refill: Capillary refill takes less than 2 seconds.   Neurological:      General: No focal deficit present.      Mental Status: She is alert and oriented to person, place, and time.   Psychiatric:         Mood and Affect: Mood normal.         Behavior: Behavior normal.         Thought Content: Thought content normal.         Judgment: Judgment normal.         ASSESSMENT AND PLAN     31 y.o. female with  No diagnosis found.   There are no diagnoses linked to this encounter.     - Cough: likely post-viral. Continue Mucinex. Loratadine sent to pharmacy.    - TMD: PT referral declined. Patient advised to avoid triggers including pen biting, and repetitive chewing activities. Advised to f/u with dentist for definitive management. OTC mouth guard should be worn overnight. Advised for oral NSAIDs and Voltaren gel.    FOLLOW-UP   Return if symptoms worsen or fail to improve, for annual PE with PCP.     SCREENING   Colon Cancer: N/a   Shingles vaccination: N/a  Pneumococcus vaccination: N/a  PAP:   MAM: N/a  Lifetime HCV: unknown    Claudie Fisherman, MD   08/18/2021, 07:54   Family Medicine Resident, PGY-2  [facility]    This note may have been partially generated using MModal Fluency Direct system, and  there may be some incorrect words, spellings, and punctuation that were not noted in checking the note before saving.

## 2021-09-01 ENCOUNTER — Ambulatory Visit (INDEPENDENT_AMBULATORY_CARE_PROVIDER_SITE_OTHER): Payer: BC Managed Care – PPO | Admitting: Medical

## 2021-09-07 ENCOUNTER — Other Ambulatory Visit (INDEPENDENT_AMBULATORY_CARE_PROVIDER_SITE_OTHER): Payer: Self-pay | Admitting: GENERAL

## 2021-09-07 ENCOUNTER — Telehealth (INDEPENDENT_AMBULATORY_CARE_PROVIDER_SITE_OTHER): Payer: Self-pay | Admitting: GENERAL

## 2021-09-07 MED ORDER — LEVONORGESTREL 0.15 MG-ETHINYL ESTRADIOL 0.03 MG TABLET
1.0000 | ORAL_TABLET | Freq: Every day | ORAL | 0 refills | Status: DC
Start: 2021-09-07 — End: 2021-09-08

## 2021-09-07 NOTE — Telephone Encounter (Signed)
.   KURVELO, 28, 0.15-0.03 mg Oral Tablet     Preferred Pharmacy     [facility] [phone] - [address] [place] - [address] AT [facility] OF [facility] [address]    [address] [place] [address]    Phone: [phone] Fax: [phone]    Hours: Not open 24 hours        Patient would like a refill on this medication

## 2021-09-08 ENCOUNTER — Encounter (INDEPENDENT_AMBULATORY_CARE_PROVIDER_SITE_OTHER): Payer: Self-pay | Admitting: Medical

## 2021-09-08 ENCOUNTER — Ambulatory Visit: Payer: BC Managed Care – PPO | Attending: Medical | Admitting: Medical

## 2021-09-08 ENCOUNTER — Other Ambulatory Visit: Payer: Self-pay

## 2021-09-08 VITALS — BP 108/80 | Ht 64.0 in | Wt 225.0 lb

## 2021-09-08 DIAGNOSIS — Z124 Encounter for screening for malignant neoplasm of cervix: Secondary | ICD-10-CM | POA: Insufficient documentation

## 2021-09-08 DIAGNOSIS — Z01419 Encounter for gynecological examination (general) (routine) without abnormal findings: Secondary | ICD-10-CM

## 2021-09-08 DIAGNOSIS — Z309 Encounter for contraceptive management, unspecified: Secondary | ICD-10-CM | POA: Insufficient documentation

## 2021-09-08 DIAGNOSIS — Z1272 Encounter for screening for malignant neoplasm of vagina: Secondary | ICD-10-CM

## 2021-09-08 DIAGNOSIS — N898 Other specified noninflammatory disorders of vagina: Secondary | ICD-10-CM

## 2021-09-08 MED ORDER — LEVONORGESTREL 0.15 MG-ETHINYL ESTRADIOL 0.03 MG TABLET
1.0000 | ORAL_TABLET | Freq: Every day | ORAL | 3 refills | Status: DC
Start: 2021-09-08 — End: 2022-08-27

## 2021-09-08 NOTE — Progress Notes (Signed)
[facility], [facility]  [address]  [place] [id number]  [facility] by [facility]  Progress Note    Name: Crystal Sutton MRN:  [id number]   Date: 09/08/2021 Age: 31 y.o.        ANNUAL VISIT    PATIENT: Crystal HainesBrittany Sutton  CHART NUMBER: [id number]  DATE OF SERVICE: 09/08/2021    CC: "Annual exam"    HPI: Crystal HainesBrittany Can is a 31 y.o. year old G1P0.  She presents to clinic for her annual exam. She is doing well. She has complaints of chunky discharge for a few days.  Often, but not always related to intercourse.  Denies itching, burning, or odor.      REVIEW OF SYSTEM  Pertinent review of system as per HPI.    OB History:  OB History   Gravida Para Term Preterm AB Living   1 1 1  0 0 1   SAB IAB Ectopic Multiple Live Births   0 0   0 1      # Outcome Date GA Lbr Len/2nd Weight Sex Delivery Anes PTL Lv   1 Term                PAST MEDICAL HISTORY:  Past Medical History:   Diagnosis Date   . COVID-19    . Gestational hypertension    . Migraines    . Obesity          Patient is 31 y.o. and reports N/A, due to age, fractures in the last six months.      PAST SURGICAL HISTORY:          FAMILY HISTORY:  Family Medical History:     Problem Relation (Age of Onset)    Alcohol abuse Father    Coronary Artery Disease Paternal Grandmother    Diabetes Maternal Grandfather    Obesity Mother, Maternal Grandmother, Maternal Grandfather            SOCIAL HISTORY:  Social History     Socioeconomic History   . Marital status: Married   Occupational History   . Occupation: [employer]   Tobacco Use   . Smoking status: Never   . Smokeless tobacco: Never   Vaping Use   . Vaping Use: Never used   Substance and Sexual Activity   . Alcohol use: Yes     Comment: occasional   . Drug use: Never   . Sexual activity: Yes     Partners: Male     Birth control/protection: Pill     Social Determinants of Health     Financial Resource Strain: Low Risk    . SDOH Financial: No   Transportation Needs: Low Risk    . SDOH  Transportation: No   Social Connections: Low Risk    . SDOH Social Isolation: 5 or more times a week   Intimate Partner Violence: Low Risk    . SDOH Domestic Violence: No   Housing Stability: Low Risk    . SDOH Housing Situation: I have housing.   Marland Kitchen. SDOH Housing Worry: No        CURRENT MEDICATIONS:   Current Outpatient Medications   Medication Sig   . cyclobenzaprine (FLEXERIL) 10 mg Oral Tablet cyclobenzaprine Take 1 tablet (Oral) 3 times per day PRN [id number] tablet 3 times per day Oral No set duration recorded No set duration amount recorded suspended 10 mg   . diclofenac sodium (VOLTAREN) 1 % Gel Apply topically Four times a  day   . Levonorgestrel-Ethinyl Estrad Williemae Natter, 28,) 0.15-0.03 mg Oral Tablet Take 1 Tablet by mouth Once a day   . loratadine (CLARITIN) 10 mg Oral Tablet Take 1 Tablet (10 mg total) by mouth Once a day for 7 days       ALLERGIES:  Patient has no known allergies.     PHYSICAL EXAMINATION:   Vitals:    09/08/21 0855   BP: 108/80   Weight: 102 kg (225 lb)   Height: 1.626 m (5\' 4" )   BMI: 38.7         Body mass index is 38.62 kg/m.   Physical Exam  Constitutional:       General: She is awake.      Appearance: Normal appearance.   Genitourinary:      No lesions in the vagina.      Right Labia: No rash, tenderness, lesions, skin changes or Bartholin's cyst.     Left Labia: No tenderness, lesions, skin changes, Bartholin's cyst or rash.     No vaginal discharge, erythema, tenderness, bleeding, ulceration or granulation tissue.      No vaginal prolapse present.     No vaginal atrophy present.       Right Adnexa: not tender, not full and no mass present.     Left Adnexa: not tender, not full and no mass present.     No cervical discharge, friability, lesion or polyp.      Uterus is not enlarged or tender.   Breasts:     Breasts are symmetrical.      Right: No swelling, mass, nipple discharge, skin change or tenderness.      Left: No swelling, mass, nipple discharge, skin change or tenderness.    Neck:      Thyroid: No thyroid mass, thyromegaly or thyroid tenderness.   Cardiovascular:      Rate and Rhythm: Normal rate and regular rhythm.      Heart sounds: Normal heart sounds.   Pulmonary:      Effort: No tachypnea, bradypnea, accessory muscle usage or prolonged expiration.      Breath sounds: Normal breath sounds.   Abdominal:      General: There is no distension.      Palpations: Abdomen is soft. There is no hepatomegaly or mass.   Musculoskeletal:      Cervical back: Normal range of motion.   Lymphadenopathy:      Cervical:      Right cervical: No superficial, deep or posterior cervical adenopathy.     Left cervical: No superficial, deep or posterior cervical adenopathy.   Neurological:      Mental Status: She is alert.          ASSESSMENT:  (Z01.419) Encounter for well woman exam with routine gynecological exam  (primary encounter diagnosis)    (Z12.4) Screening for cervical cancer  Plan: CYTOPATHOLOGY, GYN +/- HIGH RISK HPV    (Z30.9) Encounter for contraceptive management, unspecified type    (N89.8) Vaginal discharge  Plan: VAGINITIS PATHOGENS: BACTERIAL, CANDIDA, AND TV        PANEL, TMA       PLAN:  Full annual check up completed.    Pap 01/07/2019 WNL.  Pap repeated today.  Happy on OCPs.  Having menstrual migraines.  Denies aura but has light sensitivity.  Began taking this continuously at times for the last year and doing well.  Denies Fhx of breast cancer.  Complains of booger like vagina discharge 2-3 times  daily.  Denies itching, burning, or odor.  This has been going on since her pregnancy 2 years ago.  Vaginitis panel collected.  Will call with results.  Discussed may be "normal" discharge for her at this time but she prefers to be checked for infections.    Husband planning to get a vasectomy but is being treated for prostate issues first.  If unable to have vasectomy patient considering tubal.  They are both sure she does not want any future pregnancies and states they would have an  abortion if had an unplanned pregnancy.    Orders Placed This Encounter   . VAGINITIS PATHOGENS: BACTERIAL, CANDIDA, AND TV PANEL, TMA   . Levonorgestrel-Ethinyl Estrad ([place]urvelo, 28,) 0.15-0.03 mg Oral Tablet   . CYTOPATHOLOGY, GYN +/- HIGH RISK HPV        Follow up appointment in 1 year for annual exam     Landis Martins, PA-C

## 2021-09-12 LAB — VAGINITIS PATHOGENS: BACTERIAL, CANDIDA, AND TV PANEL, TMA
BACTERIAL VAGINOSIS: NEGATIVE
CANDIDA GLABRATA: NEGATIVE
CANDIDA SPECIES: NEGATIVE
TRICHOMONAS VAGINALIS: NEGATIVE

## 2021-09-13 LAB — CYTOPATHOLOGY, GYN +/- HIGH RISK HPV

## 2022-08-27 ENCOUNTER — Other Ambulatory Visit (INDEPENDENT_AMBULATORY_CARE_PROVIDER_SITE_OTHER): Payer: Self-pay | Admitting: Medical

## 2022-08-27 MED ORDER — LEVONORGESTREL 0.15 MG-ETHINYL ESTRADIOL 0.03 MG TABLET
1.0000 | ORAL_TABLET | Freq: Every day | ORAL | 0 refills | Status: AC
Start: 2022-08-27 — End: ?

## 2022-08-27 NOTE — Telephone Encounter (Signed)
Patient has a new insurance starting 08/29/22. She doesn't think we take it since it is [employer] out of [place]. (She is still checking on this) Her yearly is 09/21/22. Could she get a couple of refills on her [place] until she gets back in with her PCP in July, they will be refilling it for her at that time.Marland KitchenMarland KitchenLouie Boston Renzler

## 2022-08-27 NOTE — Telephone Encounter (Signed)
Patient notified. Anayansi Rundquist, MA

## 2022-08-27 NOTE — Telephone Encounter (Signed)
Seen last 09/08/21. Yearly scheduled 09/12/22. If it is [place], we do not. Can you send in refills? Cleophus Molt, [place]

## 2022-09-12 ENCOUNTER — Ambulatory Visit (INDEPENDENT_AMBULATORY_CARE_PROVIDER_SITE_OTHER): Payer: Self-pay | Admitting: Medical

## 2022-11-12 ENCOUNTER — Other Ambulatory Visit: Payer: Self-pay

## 2022-11-12 ENCOUNTER — Other Ambulatory Visit: Payer: BC Managed Care – PPO | Attending: FAMILY PRACTICE

## 2022-11-12 DIAGNOSIS — M2550 Pain in unspecified joint: Secondary | ICD-10-CM | POA: Insufficient documentation

## 2022-11-12 DIAGNOSIS — R635 Abnormal weight gain: Secondary | ICD-10-CM | POA: Insufficient documentation

## 2022-11-12 LAB — LIPID PANEL
CHOLESTEROL: 185 mg/dL (ref 0–200)
HDL CHOL: 52 mg/dL (ref >40)
LDL CALC: 107 mg/dL — ABNORMAL HIGH (ref <100)
TRIGLYCERIDES: 129 mg/dL (ref <150)

## 2022-11-12 LAB — BASIC METABOLIC PANEL, FASTING
ANION GAP: 8 mmol/L (ref 5–19)
BUN/CREA RATIO: 17 (ref 6–20)
BUN: 11 mg/dL (ref 7–17)
CALCIUM: 9.5 mg/dL (ref 8.4–10.2)
CHLORIDE: 105 mmol/L (ref 98–107)
CO2 TOTAL: 23 mmol/L (ref 22–30)
CREATININE: 0.65 mg/dL (ref 0.52–1.00)
ESTIMATED GFR: >60 mL/min/{1.73_m2} (ref >60)
GLUCOSE: 78 mg/dL (ref 74–106)
POTASSIUM: 4.3 mmol/L (ref 3.5–5.1)
SODIUM: 136 mmol/L — ABNORMAL LOW (ref 137–145)

## 2022-11-12 LAB — HEPATIC FUNCTION PANEL
ALBUMIN/GLOBULIN RATIO: 1.2 — ABNORMAL LOW (ref 1.5–2.5)
ALBUMIN: 4.1 g/dL (ref 3.5–5.0)
ALKALINE PHOSPHATASE: 115 U/L (ref 38–126)
ALT (SGPT): 101 U/L — ABNORMAL HIGH (ref <35)
AST (SGOT): 82 U/L — ABNORMAL HIGH (ref 14–36)
BILIRUBIN DIRECT: 0.2 mg/dL (ref 0.0–0.3)
BILIRUBIN TOTAL: 1 mg/dL (ref 0.2–1.3)
PROTEIN TOTAL: 7.4 g/dL (ref 6.3–8.2)

## 2022-11-12 LAB — LDL CHOLESTEROL, DIRECT: LDL DIRECT: 119 mg/dL — ABNORMAL HIGH (ref <100)

## 2022-11-12 LAB — SEDIMENTATION RATE: ERYTHROCYTE SEDIMENTATION RATE (ESR): 15 mm/hr (ref 0–15)

## 2022-11-12 LAB — RHEUMATOID FACTOR, SERUM: RHEUMATOID FACTOR: <9 IU/mL (ref 0–12)

## 2022-11-12 LAB — THYROID STIMULATING HORMONE (SENSITIVE TSH): TSH: 1.46 u[IU]/mL (ref 0.465–4.680)

## 2022-11-12 LAB — URIC ACID: URIC ACID: 6 mg/dL (ref 2.5–6.2)

## 2022-11-13 LAB — LYME ANTIBODY PANEL WITH REFLEX: LYME ANTIBODY TOTAL (Screen): NEGATIVE

## 2022-11-13 LAB — HEP-2 SUBSTRATE ANTINUCLEAR ANTIBODIES (ANA), SERUM: ANA INTERPRETATION: NEGATIVE

## 2022-11-19 ENCOUNTER — Other Ambulatory Visit (HOSPITAL_COMMUNITY): Payer: Self-pay | Admitting: FAMILY PRACTICE

## 2022-11-19 DIAGNOSIS — R945 Abnormal results of liver function studies: Secondary | ICD-10-CM

## 2022-11-20 ENCOUNTER — Ambulatory Visit (HOSPITAL_COMMUNITY): Payer: Self-pay
